# Patient Record
Sex: Female | Born: 1975
Health system: Southern US, Community
[De-identification: ages and names within clinical notes are randomized; demographics above are authoritative.]

## PROBLEM LIST (undated history)

## (undated) DIAGNOSIS — I469 Cardiac arrest, cause unspecified: Secondary | ICD-10-CM

## (undated) DIAGNOSIS — I255 Ischemic cardiomyopathy: Secondary | ICD-10-CM

## (undated) DIAGNOSIS — J9621 Acute and chronic respiratory failure with hypoxia: Secondary | ICD-10-CM

## (undated) DIAGNOSIS — I5022 Chronic systolic (congestive) heart failure: Secondary | ICD-10-CM

## (undated) DIAGNOSIS — G931 Anoxic brain damage, not elsewhere classified: Secondary | ICD-10-CM

---

## 2019-11-14 ENCOUNTER — Inpatient Hospital Stay
Admit: 2019-11-14 | Discharge: 2019-11-23 | Disposition: A | Discharge status: Hospice | Source: Other Acute Inpatient Hospital | Attending: Internal Medicine | Admitting: Internal Medicine

## 2019-11-14 ENCOUNTER — Other Ambulatory Visit (HOSPITAL_COMMUNITY)

## 2019-11-14 ENCOUNTER — Ambulatory Visit (HOSPITAL_COMMUNITY)
Admission: AD | Admit: 2019-11-14 | Discharge: 2019-11-14 | Disposition: A | Discharge status: Home / Self Care | Source: Other Acute Inpatient Hospital | Attending: Internal Medicine | Admitting: Internal Medicine

## 2019-11-14 DIAGNOSIS — Z931 Gastrostomy status: Secondary | ICD-10-CM

## 2019-11-14 DIAGNOSIS — I5022 Chronic systolic (congestive) heart failure: Secondary | ICD-10-CM | POA: Diagnosis present

## 2019-11-14 DIAGNOSIS — G931 Anoxic brain damage, not elsewhere classified: Secondary | ICD-10-CM | POA: Insufficient documentation

## 2019-11-14 DIAGNOSIS — I469 Cardiac arrest, cause unspecified: Secondary | ICD-10-CM | POA: Diagnosis not present

## 2019-11-14 DIAGNOSIS — I255 Ischemic cardiomyopathy: Secondary | ICD-10-CM | POA: Insufficient documentation

## 2019-11-14 DIAGNOSIS — J9621 Acute and chronic respiratory failure with hypoxia: Secondary | ICD-10-CM | POA: Diagnosis present

## 2019-11-14 DIAGNOSIS — Z9289 Personal history of other medical treatment: Secondary | ICD-10-CM

## 2019-11-14 DIAGNOSIS — J189 Pneumonia, unspecified organism: Secondary | ICD-10-CM

## 2019-11-14 HISTORY — DX: Chronic systolic (congestive) heart failure: I50.22

## 2019-11-14 HISTORY — DX: Cardiac arrest, cause unspecified: I46.9

## 2019-11-14 HISTORY — DX: Ischemic cardiomyopathy: I25.5

## 2019-11-14 HISTORY — DX: Anoxic brain damage, not elsewhere classified: G93.1

## 2019-11-14 HISTORY — DX: Acute and chronic respiratory failure with hypoxia: J96.21

## 2019-11-14 MED ORDER — IOHEXOL 300 MG/ML  SOLN
50.0000 mL | Freq: Once | INTRAMUSCULAR | Status: AC | PRN
  Administered 2019-11-14: 50 mL

## 2019-11-15 ENCOUNTER — Encounter: Payer: Self-pay | Admitting: Internal Medicine

## 2019-11-15 DIAGNOSIS — I469 Cardiac arrest, cause unspecified: Secondary | ICD-10-CM | POA: Diagnosis not present

## 2019-11-15 DIAGNOSIS — I255 Ischemic cardiomyopathy: Secondary | ICD-10-CM | POA: Diagnosis present

## 2019-11-15 DIAGNOSIS — G931 Anoxic brain damage, not elsewhere classified: Secondary | ICD-10-CM | POA: Diagnosis not present

## 2019-11-15 DIAGNOSIS — J9621 Acute and chronic respiratory failure with hypoxia: Secondary | ICD-10-CM | POA: Diagnosis not present

## 2019-11-15 DIAGNOSIS — I5022 Chronic systolic (congestive) heart failure: Secondary | ICD-10-CM

## 2019-11-15 LAB — CBC WITH DIFFERENTIAL/PLATELET
Abs Immature Granulocytes: 0.94 10*3/uL — ABNORMAL HIGH (ref 0.00–0.07)
Basophils Absolute: 0.2 10*3/uL — ABNORMAL HIGH (ref 0.0–0.1)
Basophils Relative: 1 %
Eosinophils Absolute: 0.1 10*3/uL (ref 0.0–0.5)
Eosinophils Relative: 0 %
HCT: 33.3 % — ABNORMAL LOW (ref 36.0–46.0)
Hemoglobin: 10.3 g/dL — ABNORMAL LOW (ref 12.0–15.0)
Immature Granulocytes: 4 %
Lymphocytes Relative: 14 %
Lymphs Abs: 3.1 10*3/uL (ref 0.7–4.0)
MCH: 30.7 pg (ref 26.0–34.0)
MCHC: 30.9 g/dL (ref 30.0–36.0)
MCV: 99.4 fL (ref 80.0–100.0)
Monocytes Absolute: 1.9 10*3/uL — ABNORMAL HIGH (ref 0.1–1.0)
Monocytes Relative: 9 %
Neutro Abs: 15.8 10*3/uL — ABNORMAL HIGH (ref 1.7–7.7)
Neutrophils Relative %: 72 %
Platelets: 809 10*3/uL — ABNORMAL HIGH (ref 150–400)
RBC: 3.35 MIL/uL — ABNORMAL LOW (ref 3.87–5.11)
RDW: 17.2 % — ABNORMAL HIGH (ref 11.5–15.5)
WBC: 21.9 10*3/uL — ABNORMAL HIGH (ref 4.0–10.5)
nRBC: 0.1 % (ref 0.0–0.2)

## 2019-11-15 LAB — COMPREHENSIVE METABOLIC PANEL
ALT: 16 U/L (ref 0–44)
AST: 20 U/L (ref 15–41)
Albumin: 2.8 g/dL — ABNORMAL LOW (ref 3.5–5.0)
Alkaline Phosphatase: 61 U/L (ref 38–126)
Anion gap: 9 (ref 5–15)
BUN: 7 mg/dL (ref 6–20)
CO2: 28 mmol/L (ref 22–32)
Calcium: 9.1 mg/dL (ref 8.9–10.3)
Chloride: 103 mmol/L (ref 98–111)
Creatinine, Ser: 0.49 mg/dL (ref 0.44–1.00)
GFR, Estimated: >60 mL/min (ref >60)
Glucose, Bld: 133 mg/dL — ABNORMAL HIGH (ref 70–99)
Potassium: 3.8 mmol/L (ref 3.5–5.1)
Sodium: 140 mmol/L (ref 135–145)
Total Bilirubin: 0.4 mg/dL (ref 0.3–1.2)
Total Protein: 6.8 g/dL (ref 6.5–8.1)

## 2019-11-15 NOTE — Consult Note (Addendum)
Pulmonary Critical Care Medicine [facility] [place]  PULMONARY SERVICE  Date of Service: 11/15/2019  PULMONARY CRITICAL CARE [id number] [place]  DPO:[id number]  DOB: [birth year]   DOA: 11/14/2019  Referring Physician: Carron Curie, MD  HPI: Niyah Mamaril is a 44 y.o. female seen for follow up of Acute on Chronic Respiratory Failure.  Patient has multiple medical problems including history of coronary artery disease GERD cervical spondylosis radiculopathy ongoing alcohol and tobacco use who presented to the hospital because of severe pain.  Patient apparently had severe pain in the neck and was taken some Norco for relief and then she felt as though she was going to die.  Patient also was noted to have shortness of breath.  Apparently became unresponsive and had a seizure.  EMS was called and upon arrival patient was intubated on the scene.  Patient also was noted to be in cardiac arrest requiring chest compressions defibrillation and was also given lidocaine.  Patient was found to have a STEMI with elevated troponins.  Patient did undergo a left heart catheterization which showed 100% mid occlusion of RCA had DES placed.  Subsequently patient was not able to come off the ventilator and so therefore is transferred to our facility for further management and weaning.  Review of Systems:  ROS performed and is unremarkable other than noted above.  Past Medical History:  Diagnosis Date  . Cervical spondylosis with radiculopathy 07/19/2018  . Gastritis  . GERD (gastroesophageal reflux disease)   Past Surgical History:  Procedure Laterality Date  . Colonoscopy  . Endoscopy   No Known Allergies Infusions:  . dexmedetomidine 1.7 mcg/kg/hr (10/21/19 0928)  . fentaNYL 225 mcg/hr (10/21/19 1042)  . NaCl  . norepinephrine Stopped (10/21/19 0308)  . propofol 20 mcg/kg/min (10/21/19 1040)     Social History   Socioeconomic History  . Marital status: Married   Spouse name: Not on file  . Number of children: 4  . Years of education: Not on file  . Highest education level: Not on file  Occupational History  . Not on file  Tobacco Use  . Smoking status: Current Every Day Smoker  Packs/day: 1.00  Start date: 22  . Smokeless tobacco: Never Used  . Tobacco comment: marijuana and cigarette smoker  Vaping Use  . Vaping Use: Never used  Substance and Sexual Activity  . Alcohol use: Yes  Alcohol/week: 14.0 standard drinks  Types: 14 Cans of beer per week    Medications: Reviewed on Rounds  Physical Exam:  Vitals: Temperature is 99.3 pulse 89 respiratory rate 28 blood pressure is 136/68 saturations 100%  Ventilator Settings off the ventilator on T collar with an FiO2 of 28%  . General: Comfortable at this time . Eyes: Grossly normal lids, irises & conjunctiva . ENT: grossly tongue is normal . Neck: no obvious mass . Cardiovascular: S1-S2 normal no gallop or rub . Respiratory: Scattered rhonchi very coarse breath sounds . Abdomen: Soft and nontender . Skin: no rash seen on limited exam . Musculoskeletal: not rigid . Psychiatric:unable to assess . Neurologic: no seizure no involuntary movements         Labs on Admission:  Basic Metabolic Panel: Recent Labs  Lab 11/15/19 0423  NA 140  K 3.8  CL 103  CO2 28  GLUCOSE 133*  BUN 7  CREATININE 0.49  CALCIUM 9.1    No results for input(s): PHART, PCO2ART, PO2ART, HCO3, O2SAT in the last 168 hours.  Liver Function  Tests: Recent Labs  Lab 11/15/19 0423  AST 20  ALT 16  ALKPHOS 61  BILITOT 0.4  PROT 6.8  ALBUMIN 2.8*   No results for input(s): LIPASE, AMYLASE in the last 168 hours. No results for input(s): AMMONIA in the last 168 hours.  CBC: Recent Labs  Lab 11/15/19 0423  WBC 21.9*  NEUTROABS 15.8*  HGB 10.3*  HCT 33.3*  MCV 99.4  PLT 809*    Cardiac Enzymes: No results for input(s): CKTOTAL, CKMB, CKMBINDEX, TROPONINI in the last 168 hours.  BNP (last  3 results) No results for input(s): BNP in the last 8760 hours.  ProBNP (last 3 results) No results for input(s): PROBNP in the last 8760 hours.   Radiological Exams on Admission: DG ABDOMEN PEG TUBE LOCATION  Result Date: 11/14/2019 CLINICAL DATA:  New admission.  Peg tube. EXAM: ABDOMEN - 1 VIEW COMPARISON:  None. FINDINGS: Omnipaque 300 injected into the gastrostomy. Gastrostomy is in the body the stomach. There is contrast in the stomach and duodenum. No extravasation No dilated bowel loops. IMPRESSION: Gastrostomy tube in the body the stomach. Electronically Signed   By: Marlan Palau M.D.   On: 11/14/2019 19:36   DG CHEST PORT 1 VIEW  Result Date: 11/14/2019 CLINICAL DATA:  New admission.  Tracheostomy. EXAM: PORTABLE CHEST 1 VIEW COMPARISON:  None. FINDINGS: Tracheostomy in good position. Lung volume normal. Prominent nodule lung markings diffusely and bilaterally. No lobar consolidation. No heart failure or effusion. IMPRESSION: Tracheostomy in good position Diffuse mild nodular airspace densities bilaterally. Possible acute or chronic lung disease. Correlate with prior reports. Electronically Signed   By: Marlan Palau M.D.   On: 11/14/2019 19:35    Assessment/Plan Active Problems:   Acute on chronic respiratory failure with hypoxia (HCC)   Ischemic cardiomyopathy   Chronic HFrEF (heart failure with reduced ejection fraction) (HCC)   Anoxic brain injury (HCC)   Cardiac arrest (HCC)   1. Acute on chronic respiratory failure with hypoxia right now patient is off the ventilator on T collar appears to be tolerating the weaning fairly well secretions are moderate.  We will continue with weaning as tolerated. 2. Ischemic cardiomyopathy last echocardiogram showed an ejection fraction of 20 to 25% with severe hypokinesis follow-up echocardiogram showed an EF of 40 to 45% with some improvement in wall motion.  Continue with diuresis and monitor fluid status closely. 3. 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  Family has been made aware that the overall prognosis is quite poor 4. Cardiac arrest patient did suffer cardiac arrest and that cardiogenic shock requiring pressors patient was also placed on IV amiodarone during admission for antiarrhythmic activities prognosis remains quite guarded.  I have personally seen and evaluated the patient, evaluated laboratory and imaging results, formulated the assessment and plan and placed orders. The Patient requires high complexity decision making with multiple systems involvement.  Case was discussed on Rounds with the Respiratory Therapy Director and the Respiratory staff [place] [id number] [facility] Pulmonary Critical Care Medicine Sleep Medicine

## 2019-11-16 DIAGNOSIS — I469 Cardiac arrest, cause unspecified: Secondary | ICD-10-CM | POA: Diagnosis not present

## 2019-11-16 DIAGNOSIS — I5022 Chronic systolic (congestive) heart failure: Secondary | ICD-10-CM | POA: Diagnosis not present

## 2019-11-16 DIAGNOSIS — J9621 Acute and chronic respiratory failure with hypoxia: Secondary | ICD-10-CM | POA: Diagnosis not present

## 2019-11-16 DIAGNOSIS — G931 Anoxic brain damage, not elsewhere classified: Secondary | ICD-10-CM | POA: Diagnosis not present

## 2019-11-16 LAB — BASIC METABOLIC PANEL
Anion gap: 11 (ref 5–15)
BUN: 14 mg/dL (ref 6–20)
CO2: 24 mmol/L (ref 22–32)
Calcium: 8.7 mg/dL — ABNORMAL LOW (ref 8.9–10.3)
Chloride: 107 mmol/L (ref 98–111)
Creatinine, Ser: 0.41 mg/dL — ABNORMAL LOW (ref 0.44–1.00)
GFR, Estimated: >60 mL/min (ref >60)
Glucose, Bld: 125 mg/dL — ABNORMAL HIGH (ref 70–99)
Potassium: 3.6 mmol/L (ref 3.5–5.1)
Sodium: 142 mmol/L (ref 135–145)

## 2019-11-16 LAB — CBC
HCT: 31.2 % — ABNORMAL LOW (ref 36.0–46.0)
Hemoglobin: 9.7 g/dL — ABNORMAL LOW (ref 12.0–15.0)
MCH: 31.3 pg (ref 26.0–34.0)
MCHC: 31.1 g/dL (ref 30.0–36.0)
MCV: 100.6 fL — ABNORMAL HIGH (ref 80.0–100.0)
Platelets: 664 10*3/uL — ABNORMAL HIGH (ref 150–400)
RBC: 3.1 MIL/uL — ABNORMAL LOW (ref 3.87–5.11)
RDW: 17.7 % — ABNORMAL HIGH (ref 11.5–15.5)
WBC: 14.6 10*3/uL — ABNORMAL HIGH (ref 4.0–10.5)
nRBC: 0.1 % (ref 0.0–0.2)

## 2019-11-16 LAB — MAGNESIUM: Magnesium: 2.3 mg/dL (ref 1.7–2.4)

## 2019-11-16 NOTE — Progress Notes (Addendum)
Pulmonary Critical Care Medicine [facility] [place]   PULMONARY CRITICAL CARE SERVICE  PROGRESS NOTE  Date of Service: 11/16/2019  Krystal Lopez  NLG:[id number]  DOB: 11-14-1975   DOA: 11/14/2019  Referring Physician: Carron Curie, MD  HPI: Krystal Lopez is a 44 y.o. female seen for follow up of Acute on Chronic Respiratory Failure.  Patient is currently on 21% aerosol trach collar did use PMV yesterday satting well no distress.  Medications: Reviewed on Rounds  Physical Exam:  Vitals: Pulse 81 respirations 28 BP 115/62 O2 sat 100% temp 97.9  Ventilator Settings not currently on ventilator  . General: Comfortable at this time . Eyes: Grossly normal lids, irises & conjunctiva . ENT: grossly tongue is normal . Neck: no obvious mass . Cardiovascular: S1 S2 normal no gallop . Respiratory: No rales or rhonchi noted . Abdomen: soft . Skin: no rash seen on limited exam . Musculoskeletal: not rigid . Psychiatric:unable to assess . Neurologic: no seizure no involuntary movements         Lab Data:   Basic Metabolic Panel: Recent Labs  Lab 11/15/19 0423 11/16/19 0536  NA 140 142  K 3.8 3.6  CL 103 107  CO2 28 24  GLUCOSE 133* 125*  BUN 7 14  CREATININE 0.49 0.41*  CALCIUM 9.1 8.7*  MG  --  2.3    ABG: No results for input(s): PHART, PCO2ART, PO2ART, HCO3, O2SAT in the last 168 hours.  Liver Function Tests: Recent Labs  Lab 11/15/19 0423  AST 20  ALT 16  ALKPHOS 61  BILITOT 0.4  PROT 6.8  ALBUMIN 2.8*   No results for input(s): LIPASE, AMYLASE in the last 168 hours. No results for input(s): AMMONIA in the last 168 hours.  CBC: Recent Labs  Lab 11/15/19 0423 11/16/19 0536  WBC 21.9* 14.6*  NEUTROABS 15.8*  --   HGB 10.3* 9.7*  HCT 33.3* 31.2*  MCV 99.4 100.6*  PLT 809* 664*    Cardiac Enzymes: No results for input(s): CKTOTAL, CKMB, CKMBINDEX, TROPONINI in the last 168 hours.  BNP (last 3 results) No results  for input(s): BNP in the last 8760 hours.  ProBNP (last 3 results) No results for input(s): PROBNP in the last 8760 hours.  Radiological Exams: DG ABDOMEN PEG TUBE LOCATION  Result Date: 11/14/2019 CLINICAL DATA:  New admission.  Peg tube. EXAM: ABDOMEN - 1 VIEW COMPARISON:  None. FINDINGS: Omnipaque 300 injected into the gastrostomy. Gastrostomy is in the body the stomach. There is contrast in the stomach and duodenum. No extravasation No dilated bowel loops. IMPRESSION: Gastrostomy tube in the body the stomach. Electronically Signed   By: Marlan Palau M.D.   On: 11/14/2019 19:36   DG CHEST PORT 1 VIEW  Result Date: 11/14/2019 CLINICAL DATA:  New admission.  Tracheostomy. EXAM: PORTABLE CHEST 1 VIEW COMPARISON:  None. FINDINGS: Tracheostomy in good position. Lung volume normal. Prominent nodule lung markings diffusely and bilaterally. No lobar consolidation. No heart failure or effusion. IMPRESSION: Tracheostomy in good position Diffuse mild nodular airspace densities bilaterally. Possible acute or chronic lung disease. Correlate with prior reports. Electronically Signed   By: Marlan Palau M.D.   On: 11/14/2019 19:35    Assessment/Plan Active Problems:   Acute on chronic respiratory failure with hypoxia (HCC)   Ischemic cardiomyopathy   Chronic HFrEF (heart failure with reduced ejection fraction) (HCC)   Anoxic brain injury (HCC)   Cardiac arrest (HCC)   1. Acute on chronic respiratory failure with  hypoxia patient continues to be off the ventilator at this time on 21% aerosol trach collar we will downsize trach to a #6 cuffless today and encourage more PMV use.  We will continue aggressive pulmonary toilet supportive measures. 2. Ischemic cardiomyopathy last echocardiogram showed an ejection fraction of 20 to 25% with severe hypokinesis follow-up echocardiogram showed an EF of 40 to 45% with some improvement in wall motion.  Continue with diuresis and monitor fluid status  closely. 3. 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  Family has been made aware that the overall prognosis is quite poor 4. Cardiac arrest hemodynamically stable at this time.   I have personally seen and evaluated the patient, evaluated laboratory and imaging results, formulated the assessment and plan and placed orders. The Patient requires high complexity decision making with multiple systems involvement.  Rounds were done with the Respiratory Therapy Director and Staff therapists and discussed with nursing staff also.  Yevonne Pax, MD [facility] Pulmonary Critical Care Medicine Sleep Medicine

## 2019-11-17 DIAGNOSIS — G931 Anoxic brain damage, not elsewhere classified: Secondary | ICD-10-CM | POA: Diagnosis not present

## 2019-11-17 DIAGNOSIS — J9621 Acute and chronic respiratory failure with hypoxia: Secondary | ICD-10-CM | POA: Diagnosis not present

## 2019-11-17 DIAGNOSIS — I469 Cardiac arrest, cause unspecified: Secondary | ICD-10-CM | POA: Diagnosis not present

## 2019-11-17 DIAGNOSIS — I5022 Chronic systolic (congestive) heart failure: Secondary | ICD-10-CM | POA: Diagnosis not present

## 2019-11-17 NOTE — Progress Notes (Signed)
Pulmonary Critical Care Medicine [facility] [place]   PULMONARY CRITICAL CARE SERVICE  PROGRESS NOTE  Date of Service: 11/17/2019  Krystal Lopez  YHC:[id number]  DOB: 08-17-1975   DOA: 11/14/2019  Referring Physician: Carron Curie, MD  HPI: Krystal Lopez is a 44 y.o. female seen for follow up of Acute on Chronic Respiratory Failure.  Patient currently is on T collar has been on room air should be able to start capping trials today  Medications: Reviewed on Rounds  Physical Exam:  Vitals: Temperature is 96.8 pulse 73 respiratory rate 22 blood pressure is 97/55 saturations 98%  Ventilator Settings on T collar room air  . General: Comfortable at this time . Eyes: Grossly normal lids, irises & conjunctiva . ENT: grossly tongue is normal . Neck: no obvious mass . Cardiovascular: S1 S2 normal no gallop . Respiratory: No rhonchi very coarse breath sounds . Abdomen: soft . Skin: no rash seen on limited exam . Musculoskeletal: not rigid . Psychiatric:unable to assess . Neurologic: no seizure no involuntary movements         Lab Data:   Basic Metabolic Panel: Recent Labs  Lab 11/15/19 0423 11/16/19 0536  NA 140 142  K 3.8 3.6  CL 103 107  CO2 28 24  GLUCOSE 133* 125*  BUN 7 14  CREATININE 0.49 0.41*  CALCIUM 9.1 8.7*  MG  --  2.3    ABG: No results for input(s): PHART, PCO2ART, PO2ART, HCO3, O2SAT in the last 168 hours.  Liver Function Tests: Recent Labs  Lab 11/15/19 0423  AST 20  ALT 16  ALKPHOS 61  BILITOT 0.4  PROT 6.8  ALBUMIN 2.8*   No results for input(s): LIPASE, AMYLASE in the last 168 hours. No results for input(s): AMMONIA in the last 168 hours.  CBC: Recent Labs  Lab 11/15/19 0423 11/16/19 0536  WBC 21.9* 14.6*  NEUTROABS 15.8*  --   HGB 10.3* 9.7*  HCT 33.3* 31.2*  MCV 99.4 100.6*  PLT 809* 664*    Cardiac Enzymes: No results for input(s): CKTOTAL, CKMB, CKMBINDEX, TROPONINI in the last 168  hours.  BNP (last 3 results) No results for input(s): BNP in the last 8760 hours.  ProBNP (last 3 results) No results for input(s): PROBNP in the last 8760 hours.  Radiological Exams: No results found.  Assessment/Plan Active Problems:   Acute on chronic respiratory failure with hypoxia (HCC)   Ischemic cardiomyopathy   Chronic HFrEF (heart failure with reduced ejection fraction) (HCC)   Anoxic brain injury (HCC)   Cardiac arrest (HCC)   1. Acute on chronic respiratory failure with hypoxia we will continue with the T collar weaning during the day and nighttime.  Patient is ready for capping we will have him proceed for capping today 2. Ischemic cardiomyopathy no change continue with supportive care 3. Chronic heart failure reduced ejection fraction we will continue to follow 4. Anoxic brain injury supportive care 5. Cardiac arrest rhythm has been stable   I have personally seen and evaluated the patient, evaluated laboratory and imaging results, formulated the assessment and plan and placed orders. The Patient requires high complexity decision making with multiple systems involvement.  Rounds were done with the Respiratory Therapy Director and Staff therapists and discussed with nursing staff also.  Yevonne Pax, MD [facility] Pulmonary Critical Care Medicine Sleep Medicine

## 2019-11-18 ENCOUNTER — Other Ambulatory Visit (HOSPITAL_COMMUNITY)

## 2019-11-18 DIAGNOSIS — J9621 Acute and chronic respiratory failure with hypoxia: Secondary | ICD-10-CM | POA: Diagnosis not present

## 2019-11-18 DIAGNOSIS — G931 Anoxic brain damage, not elsewhere classified: Secondary | ICD-10-CM | POA: Diagnosis not present

## 2019-11-18 DIAGNOSIS — I469 Cardiac arrest, cause unspecified: Secondary | ICD-10-CM | POA: Diagnosis not present

## 2019-11-18 DIAGNOSIS — I5022 Chronic systolic (congestive) heart failure: Secondary | ICD-10-CM | POA: Diagnosis not present

## 2019-11-18 LAB — URINALYSIS, ROUTINE W REFLEX MICROSCOPIC
Bilirubin Urine: NEGATIVE
Glucose, UA: NEGATIVE mg/dL
Hgb urine dipstick: NEGATIVE
Ketones, ur: NEGATIVE mg/dL
Leukocytes,Ua: NEGATIVE
Nitrite: NEGATIVE
Protein, ur: NEGATIVE mg/dL
Specific Gravity, Urine: 1.018 (ref 1.005–1.030)
pH: 7 (ref 5.0–8.0)

## 2019-11-18 NOTE — Progress Notes (Addendum)
Pulmonary Critical Care Medicine [facility] [place]   PULMONARY CRITICAL CARE SERVICE  PROGRESS NOTE  Date of Service: 11/18/2019  Krystal Lopez  WSF:[id number]  DOB: April 28, 1975   DOA: 11/14/2019  Referring Physician: Carron Curie, MD  HPI: Krystal Lopez is a 44 y.o. female seen for follow up of Acute on Chronic Respiratory Failure.  Patient remains capped for 24 hours at this time satting well on room air.  Medications: Reviewed on Rounds  Physical Exam:  Vitals: Pulse 86 respirations 21 BP 138/80 O2 sat 98% temp 98.2  Ventilator Settings not currently on ventilator  . General: Comfortable at this time . Eyes: Grossly normal lids, irises & conjunctiva . ENT: grossly tongue is normal . Neck: no obvious mass . Cardiovascular: S1 S2 normal no gallop . Respiratory: No rales or rhonchi noted . Abdomen: soft . Skin: no rash seen on limited exam . Musculoskeletal: not rigid . Psychiatric:unable to assess . Neurologic: no seizure no involuntary movements         Lab Data:   Basic Metabolic Panel: Recent Labs  Lab 11/15/19 0423 11/16/19 0536  NA 140 142  K 3.8 3.6  CL 103 107  CO2 28 24  GLUCOSE 133* 125*  BUN 7 14  CREATININE 0.49 0.41*  CALCIUM 9.1 8.7*  MG  --  2.3    ABG: No results for input(s): PHART, PCO2ART, PO2ART, HCO3, O2SAT in the last 168 hours.  Liver Function Tests: Recent Labs  Lab 11/15/19 0423  AST 20  ALT 16  ALKPHOS 61  BILITOT 0.4  PROT 6.8  ALBUMIN 2.8*   No results for input(s): LIPASE, AMYLASE in the last 168 hours. No results for input(s): AMMONIA in the last 168 hours.  CBC: Recent Labs  Lab 11/15/19 0423 11/16/19 0536  WBC 21.9* 14.6*  NEUTROABS 15.8*  --   HGB 10.3* 9.7*  HCT 33.3* 31.2*  MCV 99.4 100.6*  PLT 809* 664*    Cardiac Enzymes: No results for input(s): CKTOTAL, CKMB, CKMBINDEX, TROPONINI in the last 168 hours.  BNP (last 3 results) No results for input(s): BNP in the  last 8760 hours.  ProBNP (last 3 results) No results for input(s): PROBNP in the last 8760 hours.  Radiological Exams: No results found.  Assessment/Plan Active Problems:   Acute on chronic respiratory failure with hypoxia (HCC)   Ischemic cardiomyopathy   Chronic HFrEF (heart failure with reduced ejection fraction) (HCC)   Anoxic brain injury (HCC)   Cardiac arrest (HCC)   1. Acute on chronic respiratory failure with hypoxia patient remains capped for 24 hours will continue on room air at this time continue supportive measures pulmonary toilet. 2. Ischemic cardiomyopathy no change continue with supportive care 3. Chronic heart failure reduced ejection fraction we will continue to follow 4. Anoxic brain injury supportive care 5. Cardiac arrest rhythm has been stable   I have personally seen and evaluated the patient, evaluated laboratory and imaging results, formulated the assessment and plan and placed orders. The Patient requires high complexity decision making with multiple systems involvement.  Rounds were done with the Respiratory Therapy Director and Staff therapists and discussed with nursing staff also.  Yevonne Pax, MD [facility] Pulmonary Critical Care Medicine Sleep Medicine

## 2019-11-19 DIAGNOSIS — I469 Cardiac arrest, cause unspecified: Secondary | ICD-10-CM | POA: Diagnosis not present

## 2019-11-19 DIAGNOSIS — J9621 Acute and chronic respiratory failure with hypoxia: Secondary | ICD-10-CM | POA: Diagnosis not present

## 2019-11-19 DIAGNOSIS — G931 Anoxic brain damage, not elsewhere classified: Secondary | ICD-10-CM | POA: Diagnosis not present

## 2019-11-19 DIAGNOSIS — I5022 Chronic systolic (congestive) heart failure: Secondary | ICD-10-CM | POA: Diagnosis not present

## 2019-11-19 LAB — URINE CULTURE: Culture: NO GROWTH

## 2019-11-19 NOTE — Progress Notes (Addendum)
Pulmonary Critical Care Medicine [facility] [place]   PULMONARY CRITICAL CARE SERVICE  PROGRESS NOTE  Date of Service: 11/19/2019  Krystal Lopez  DJM:[id number]  DOB: Feb 25, 1975   DOA: 11/14/2019  Referring Physician: Carron Curie, MD  HPI: Krystal Lopez is a 44 y.o. female seen for follow up of Acute on Chronic Respiratory Failure.  Doing well with capping completing 48 hours today should be ready for decannulation  Medications: Reviewed on Rounds  Physical Exam:  Vitals: Temperature is 99.4 pulse 98 respiratory rate is 19 blood pressure is 131/79 saturations 98%  Ventilator Settings capping off the ventilator  . General: Comfortable at this time . Eyes: Grossly normal lids, irises & conjunctiva . ENT: grossly tongue is normal . Neck: no obvious mass . Cardiovascular: S1 S2 normal no gallop . Respiratory: No rhonchi no rales are noted at this time . Abdomen: soft . Skin: no rash seen on limited exam . Musculoskeletal: not rigid . Psychiatric:unable to assess . Neurologic: no seizure no involuntary movements         Lab Data:   Basic Metabolic Panel: Recent Labs  Lab 11/15/19 0423 11/16/19 0536  NA 140 142  K 3.8 3.6  CL 103 107  CO2 28 24  GLUCOSE 133* 125*  BUN 7 14  CREATININE 0.49 0.41*  CALCIUM 9.1 8.7*  MG  --  2.3    ABG: No results for input(s): PHART, PCO2ART, PO2ART, HCO3, O2SAT in the last 168 hours.  Liver Function Tests: Recent Labs  Lab 11/15/19 0423  AST 20  ALT 16  ALKPHOS 61  BILITOT 0.4  PROT 6.8  ALBUMIN 2.8*   No results for input(s): LIPASE, AMYLASE in the last 168 hours. No results for input(s): AMMONIA in the last 168 hours.  CBC: Recent Labs  Lab 11/15/19 0423 11/16/19 0536  WBC 21.9* 14.6*  NEUTROABS 15.8*  --   HGB 10.3* 9.7*  HCT 33.3* 31.2*  MCV 99.4 100.6*  PLT 809* 664*    Cardiac Enzymes: No results for input(s): CKTOTAL, CKMB, CKMBINDEX, TROPONINI in the last 168  hours.  BNP (last 3 results) No results for input(s): BNP in the last 8760 hours.  ProBNP (last 3 results) No results for input(s): PROBNP in the last 8760 hours.  Radiological Exams: No results found.  Assessment/Plan Active Problems:   Acute on chronic respiratory failure with hypoxia (HCC)   Ischemic cardiomyopathy   Chronic HFrEF (heart failure with reduced ejection fraction) (HCC)   Anoxic brain injury (HCC)   Cardiac arrest (HCC)   1. Acute on chronic respiratory failure with hypoxia we will continue with capping to complete 48 hours once this is done proceed to decannulate 2. Ischemic cardiomyopathy no change we will continue with supportive care 3. Chronic heart failure reduced ejection fraction supportive care monitor fluid status 4. Anoxic insult brain injury improved at baseline 5. Cardiac arrest rhythm has been stable so far   I have personally seen and evaluated the patient, evaluated laboratory and imaging results, formulated the assessment and plan and placed orders. The Patient requires high complexity decision making with multiple systems involvement.  Rounds were done with the Respiratory Therapy Director and Staff therapists and discussed with nursing staff also.  Yevonne Pax, MD [facility] Pulmonary Critical Care Medicine Sleep Medicine

## 2019-11-20 LAB — CBC
HCT: 36.4 % (ref 36.0–46.0)
Hemoglobin: 11.4 g/dL — ABNORMAL LOW (ref 12.0–15.0)
MCH: 31.1 pg (ref 26.0–34.0)
MCHC: 31.3 g/dL (ref 30.0–36.0)
MCV: 99.5 fL (ref 80.0–100.0)
Platelets: 541 10*3/uL — ABNORMAL HIGH (ref 150–400)
RBC: 3.66 MIL/uL — ABNORMAL LOW (ref 3.87–5.11)
RDW: 18.6 % — ABNORMAL HIGH (ref 11.5–15.5)
WBC: 12 10*3/uL — ABNORMAL HIGH (ref 4.0–10.5)
nRBC: 0 % (ref 0.0–0.2)

## 2019-11-20 LAB — BASIC METABOLIC PANEL
Anion gap: 13 (ref 5–15)
BUN: 15 mg/dL (ref 6–20)
CO2: 24 mmol/L (ref 22–32)
Calcium: 9.4 mg/dL (ref 8.9–10.3)
Chloride: 105 mmol/L (ref 98–111)
Creatinine, Ser: 0.51 mg/dL (ref 0.44–1.00)
GFR, Estimated: >60 mL/min (ref >60)
Glucose, Bld: 103 mg/dL — ABNORMAL HIGH (ref 70–99)
Potassium: 3.6 mmol/L (ref 3.5–5.1)
Sodium: 142 mmol/L (ref 135–145)

## 2021-10-15 IMAGING — DX DG CHEST 1V PORT
1 series · 1 of 1 positions shown · non-contrast
Comparison: None.

CLINICAL DATA: New admission.  Tracheostomy.

EXAM:
PORTABLE CHEST 1 VIEW

[chest]
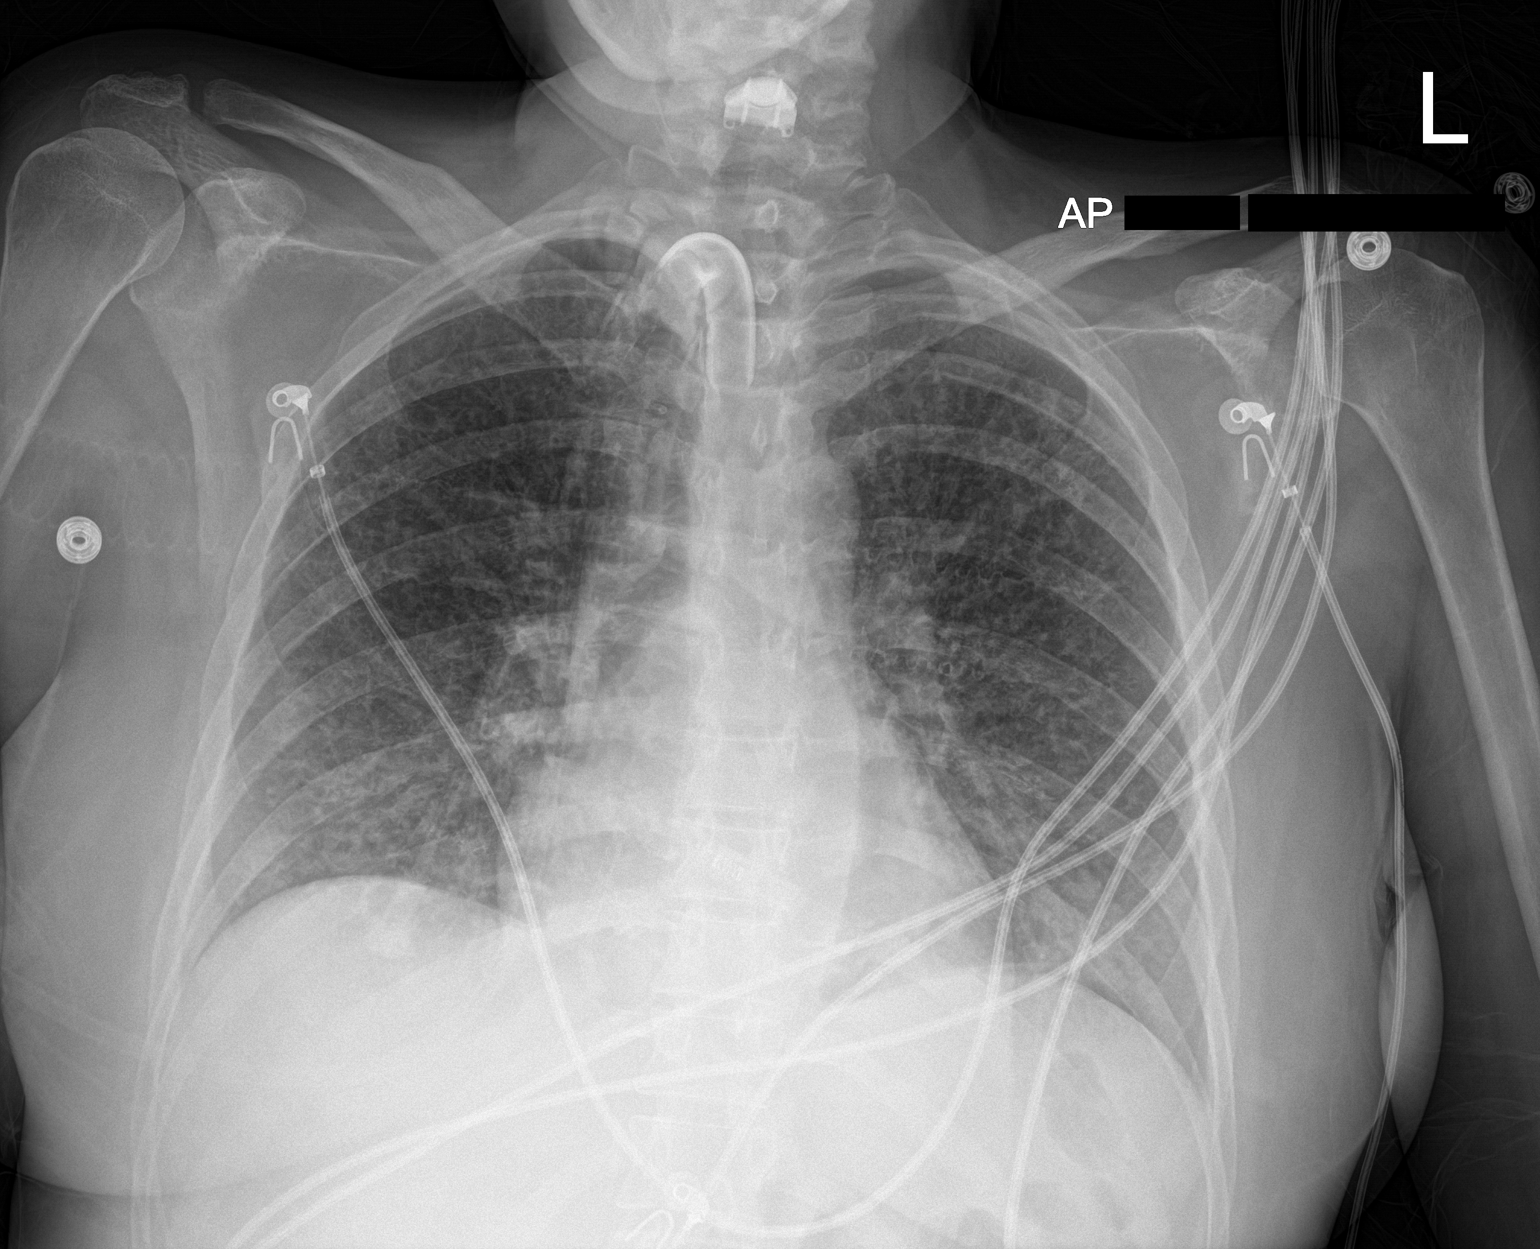

[1 of 1 positions shown; findings below may reference images not displayed]

FINDINGS: Tracheostomy in good position.

Lung volume normal. Prominent nodule lung markings diffusely and
bilaterally. No lobar consolidation. No heart failure or effusion.
IMPRESSION: Tracheostomy in good position

Diffuse mild nodular airspace densities bilaterally. Possible acute
or chronic lung disease. Correlate with prior reports.

## 2021-10-15 IMAGING — DX DG ABDOMEN 1V
1 series · 1 of 1 positions shown · non-contrast
Comparison: None.

CLINICAL DATA: New admission.  Peg tube.

EXAM:
ABDOMEN - 1 VIEW

[abdomen]
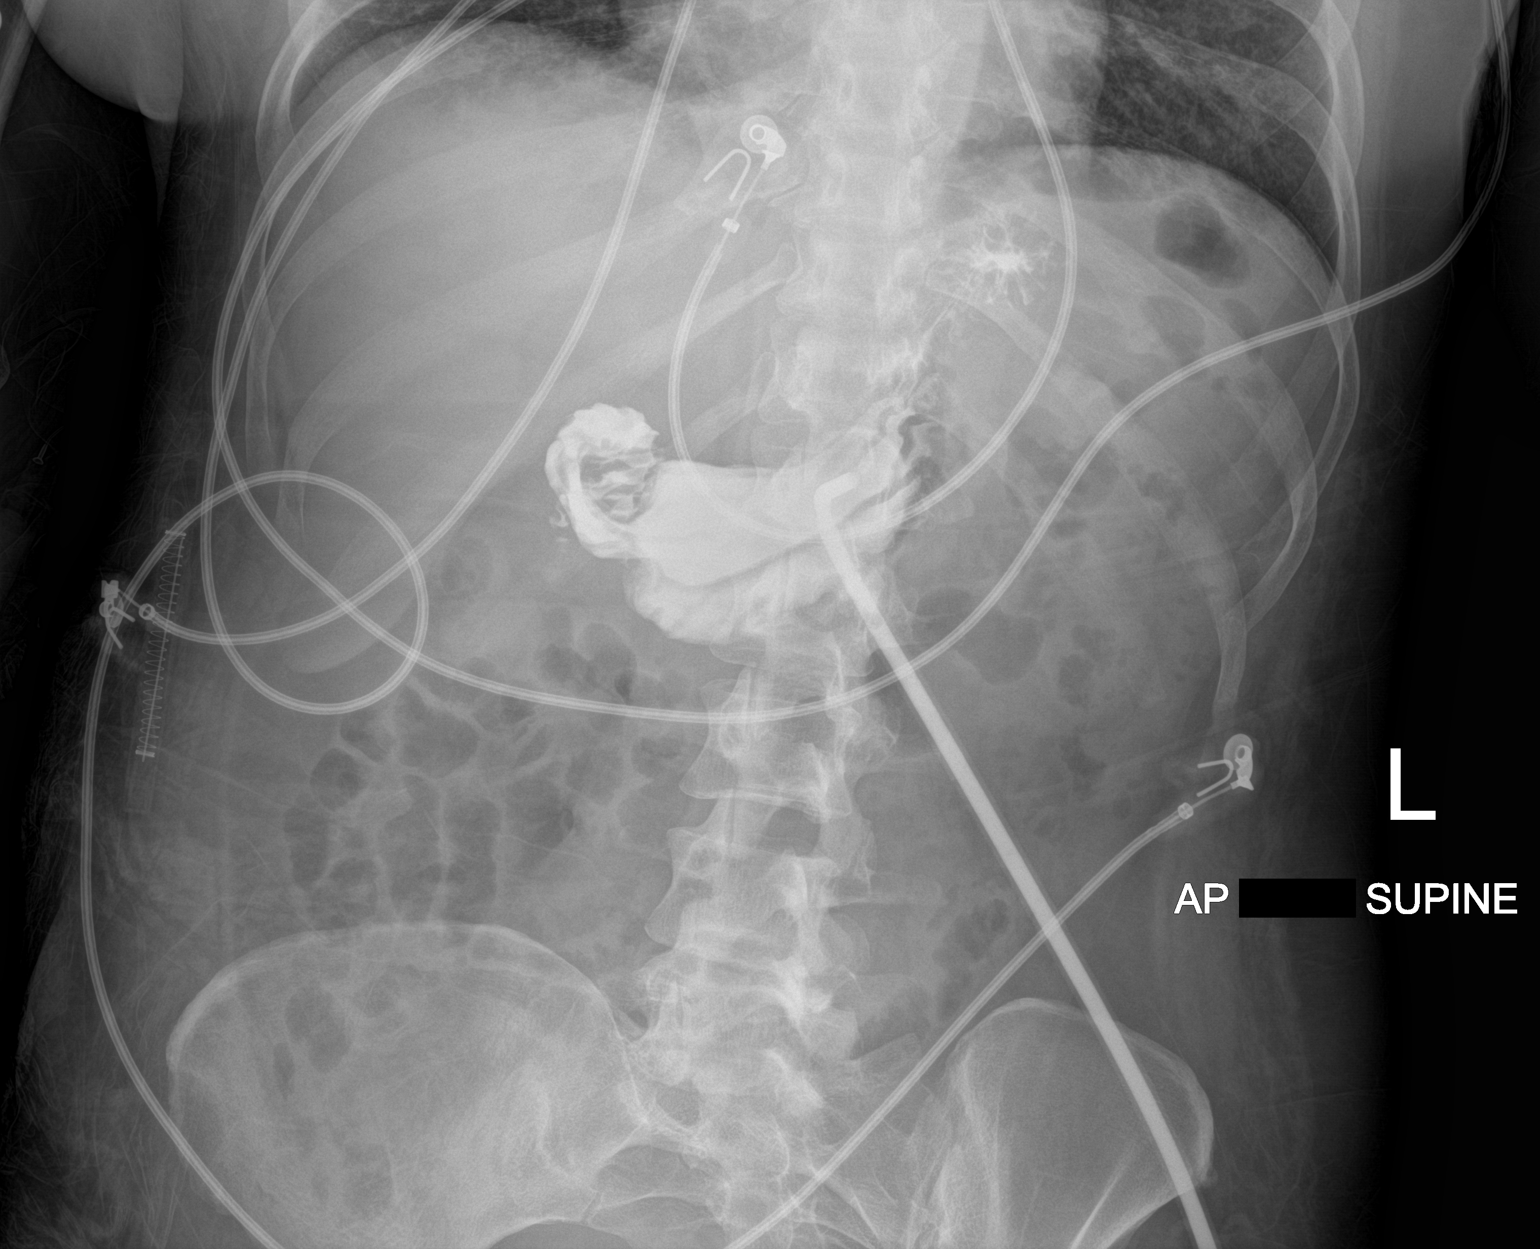

[1 of 1 positions shown; findings below may reference images not displayed]

FINDINGS: Omnipaque 300 injected into the gastrostomy. Gastrostomy is in the
body the stomach. There is contrast in the stomach and duodenum. No
extravasation

No dilated bowel loops.
IMPRESSION: Gastrostomy tube in the body the stomach.
